# Patient Record
Sex: Male | Born: 1995 | Race: Black or African American | Hispanic: No | Marital: Single | Smoking: Never smoker
Health system: Southern US, Community
[De-identification: ages and names within clinical notes are randomized; demographics above are authoritative.]

## PROBLEM LIST (undated history)

## (undated) DIAGNOSIS — S52502A Unspecified fracture of the lower end of left radius, initial encounter for closed fracture: Secondary | ICD-10-CM

## (undated) HISTORY — PX: NO PAST SURGERIES: SHX2092

---

## 2016-03-22 ENCOUNTER — Emergency Department (HOSPITAL_COMMUNITY): Payer: Self-pay

## 2016-03-22 ENCOUNTER — Emergency Department (HOSPITAL_COMMUNITY)
Admission: EM | Admit: 2016-03-22 | Discharge: 2016-03-22 | Disposition: A | Discharge status: Home / Self Care | Payer: Self-pay | Attending: Emergency Medicine | Admitting: Emergency Medicine

## 2016-03-22 ENCOUNTER — Encounter (HOSPITAL_COMMUNITY): Payer: Self-pay | Admitting: *Deleted

## 2016-03-22 DIAGNOSIS — W500XXA Accidental hit or strike by another person, initial encounter: Secondary | ICD-10-CM | POA: Insufficient documentation

## 2016-03-22 DIAGNOSIS — F1290 Cannabis use, unspecified, uncomplicated: Secondary | ICD-10-CM | POA: Insufficient documentation

## 2016-03-22 DIAGNOSIS — S52502A Unspecified fracture of the lower end of left radius, initial encounter for closed fracture: Secondary | ICD-10-CM | POA: Insufficient documentation

## 2016-03-22 DIAGNOSIS — Y929 Unspecified place or not applicable: Secondary | ICD-10-CM | POA: Insufficient documentation

## 2016-03-22 DIAGNOSIS — S6992XA Unspecified injury of left wrist, hand and finger(s), initial encounter: Secondary | ICD-10-CM

## 2016-03-22 DIAGNOSIS — S5290XA Unspecified fracture of unspecified forearm, initial encounter for closed fracture: Secondary | ICD-10-CM

## 2016-03-22 DIAGNOSIS — Y998 Other external cause status: Secondary | ICD-10-CM | POA: Insufficient documentation

## 2016-03-22 DIAGNOSIS — Y9361 Activity, american tackle football: Secondary | ICD-10-CM | POA: Insufficient documentation

## 2016-03-22 DIAGNOSIS — Q7192 Unspecified reduction defect of left upper limb: Secondary | ICD-10-CM | POA: Insufficient documentation

## 2016-03-22 DIAGNOSIS — S5292XA Unspecified fracture of left forearm, initial encounter for closed fracture: Secondary | ICD-10-CM

## 2016-03-22 HISTORY — DX: Unspecified fracture of the lower end of left radius, initial encounter for closed fracture: S52.502A

## 2016-03-22 MED ORDER — FENTANYL CITRATE (PF) 100 MCG/2ML IJ SOLN
100.0000 ug | INTRAMUSCULAR | Status: DC | PRN
  Administered 2016-03-22 (×2): 100 ug via INTRAVENOUS
  Filled 2016-03-22: qty 2

## 2016-03-22 MED ORDER — PROPOFOL 10 MG/ML IV BOLUS
200.0000 mg | Freq: Once | INTRAVENOUS | Status: AC
  Administered 2016-03-22: 160 mg via INTRAVENOUS
  Filled 2016-03-22: qty 20

## 2016-03-22 MED ORDER — OXYCODONE HCL 5 MG PO TABS: 5.0000 mg | ORAL_TABLET | ORAL | 0 refills | Status: DC | PRN

## 2016-03-22 NOTE — Consult Note (Signed)
ORTHOPAEDIC CONSULTATION HISTORY & PHYSICAL REQUESTING PHYSICIAN: Rolland Porter, MD  Chief Complaint: Left wrist injury  HPI: Kenneth Haynes is a 20 y.o. 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 who presents for evaluation of a left wrist injury that occurred in again today.  He presented with swelling, deformity, and pain of the left wrist.  X-rays again obtained revealing a markedly displaced distal radius fracture with a separate third fragment.  It is difficult to tell whether there is intra-articular extension.  History reviewed. No pertinent past medical history. History reviewed. No pertinent surgical history. Social History   Social History  . Marital status: Single    Spouse name: N/A  . Number of children: N/A  . Years of education: N/A   Social History Main Topics  . Smoking status: None  . Smokeless tobacco: None  . Alcohol use None  . Drug use: Unknown  . Sexual activity: Not Asked   Other Topics Concern  . None   Social History Narrative  . None   No family history on file. No Known Allergies Prior to Admission medications   Not on File   Dg Wrist 2 Views Left  Addendum Date: 03/22/2016   ADDENDUM REPORT: 03/22/2016 16:13 ADDENDUM: A lateral view was performed and demonstrates 1.8 cm dorsal displacement and apex volar angulation of the distal radial fracture. Electronically Signed   By: Harmon Pier M.D.   On: 03/22/2016 16:13   Result Date: 03/22/2016 CLINICAL DATA:  Acute left wrist pain and deformity following football injury today. Initial encounter. EXAM: LEFT WRIST - 2 VIEW COMPARISON:  None. FINDINGS: A mildly comminuted distal radial fracture is noted with at least 2 cm ulnar and dorsal displacement and angulation. The radiocarpal joint appears intact. No other fractures are identified. IMPRESSION: Displaced and angulated mildly comminuted distal radial fracture. Electronically Signed: By: Harmon Pier M.D. On: 03/22/2016 15:15    Positive ROS: All other  systems have been reviewed and were otherwise negative with the exception of those mentioned in the HPI and as above.  Physical Exam: Vitals: Refer to EMR. Constitutional:  WD, WN, NAD HEENT:  NCAT, EOMI Neuro/Psych:  Alert & oriented to person, place, and time; appropriate mood & affect Lymphatic: No generalized extremity edema or lymphadenopathy Extremities / MSK:  The extremities are normal with respect to appearance, ranges of motion, joint stability, muscle strength/tone, sensation, & perfusion except as otherwise noted:  Left wrist markedly deformed with obvious dorsal translational displacement of the distal aspect.  The wrist is swollen, forearm compartments not tense.  Intact light touch sensibility in the radial, median, and ulnar nerve distributions with intact motor to the same.  Nailbeds pink, capillary refill brisk.  Radial pulse palpable.  No tenderness about the elbow.  Assessment: Displaced left distal radius fracture with at least 2 distal fragments  Plan: I discussed these findings with the patient, his mother, and his coach.  I recommended a plan for closed reduction today in emergency department with conscious sedation provided by the emergency department.  They consented to proceed in this was performed with gentle manipulative reduction.  Portable x-ray was available and guided the reduction.  The alignment of the distal articular margin shaft of the radius was near-anatomic following reduction, but the third separate fragment appeared to be an extruded fragment and was resting volar to the re-remainder of the radius.  Sugar tong splint was applied and he was placed into a sling.  He will be discharged in emergency department  today with precautions regarding neurovascular compromise and swelling, an analgesic plan, and we will also obtain a CT scan of the left wrist prior to discharge to assist in operative planning.  My office will contact him Monday or Tuesday regarding  follow-up plan and timing for surgery, which is likely to be on Monday, 03-30-16.  Cliffton Astersavid A. Janee Mornhompson, MD      [facility]edic & [facility] [address] [place], [place]  [id number] Office: [phone] Mobile: [phone]  03/22/2016, 5:12 PM

## 2016-03-22 NOTE — ED Triage Notes (Addendum)
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  Patient received 100 mcg of Fentanyl en route which eliminated his pain initially, however, his pain is now 10/10.  Patient's vitals WNL.  Patient's LUE is splinted, difficult to palpate pulse.

## 2016-03-22 NOTE — ED Provider Notes (Signed)
[facility]-EMERGENCY DEPT Provider Note   CSN: [id number] Arrival date & time: 03/22/16  1432     History   Chief Complaint Chief Complaint  Patient presents with  . Wrist Injury    left     HPI Kenneth Haynes is a 20 y.o. male. 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 He was being tackled today. Was holding the ball with his right hand. He fell and another player fell across his left upper extremity. He has a deformity of his left wrist. He arrives via ambulance with his arm in a sling.  HPI  History reviewed. No pertinent past medical history.  There are no active problems to display for this patient.   History reviewed. No pertinent surgical history.     Home Medications    Prior to Admission medications   Medication Sig Start Date End Date Taking? Authorizing Provider  oxyCODONE (ROXICODONE) 5 MG immediate release tablet Take 1 tablet (5 mg total) by mouth every 4 (four) hours as needed for severe pain or breakthrough pain (unrelieved by ibuprofen and tylenol). 03/22/16   Mack Hook, MD    Family History No family history on file.  Social History Social History  Substance Use Topics  . Smoking status: Not on file  . Smokeless tobacco: Not on file  . Alcohol use Not on file     Allergies   Review of patient's allergies indicates no known allergies.   Review of Systems Review of Systems  Constitutional: Negative for appetite change, chills, diaphoresis, fatigue and fever.  HENT: Negative for mouth sores, sore throat and trouble swallowing.   Eyes: Negative for visual disturbance.  Respiratory: Negative for cough, chest tightness, shortness of breath and wheezing.   Cardiovascular: Negative for chest pain.  Gastrointestinal: Negative for abdominal distention, abdominal pain, diarrhea, nausea and vomiting.  Endocrine: Negative for polydipsia, polyphagia and polyuria.  Genitourinary: Negative for dysuria, frequency and hematuria.    Musculoskeletal: Negative for gait problem.       Left wrist pain and deformity.  Skin: Negative for color change, pallor and rash.  Neurological: Negative for dizziness, syncope, light-headedness and headaches.  Hematological: Does not bruise/bleed easily.  Psychiatric/Behavioral: Negative for behavioral problems and confusion.     Physical Exam Updated Vital Signs BP 127/67   Pulse 74   Temp 98 F (36.7 C)   Resp 17   Wt 250 lb (113.4 kg)   SpO2 99%   Physical Exam  Constitutional: He is oriented to person, place, and time. He appears well-developed and well-nourished. No distress.  HENT:  Head: Normocephalic.  Eyes: Conjunctivae are normal. Pupils are equal, round, and reactive to light. No scleral icterus.  Neck: Normal range of motion. Neck supple. No thyromegaly present.  Cardiovascular: Normal rate and regular rhythm.  Exam reveals no gallop and no friction rub.   No murmur heard. Pulmonary/Chest: Effort normal and breath sounds normal. No respiratory distress. He has no wheezes. He has no rales.  Abdominal: Soft. Bowel sounds are normal. He exhibits no distension. There is no tenderness. There is no rebound.  Musculoskeletal: Normal range of motion.  45 angulation of apex volar deformity of the left distal wrist. Good sensation Refill in all digits. Closed injury. No tenting or compromise of the skin.  Neurological: He is alert and oriented to person, place, and time.  Skin: Skin is warm and dry. No rash noted.  Psychiatric: He has a normal mood and affect. His behavior is  normal.     ED Treatments / Results  Labs (all labs ordered are listed, but only abnormal results are displayed) Labs Reviewed - No data to display  EKG  EKG Interpretation None       Radiology Dg Wrist 2 Views Left  Addendum Date: 03/22/2016   ADDENDUM REPORT: 03/22/2016 16:13 ADDENDUM: A lateral view was performed and demonstrates 1.8 cm dorsal displacement and apex volar angulation  of the distal radial fracture. Electronically Signed   By: Harmon Pier M.D.   On: 03/22/2016 16:13   Result Date: 03/22/2016 CLINICAL DATA:  Acute left wrist pain and deformity following football injury today. Initial encounter. EXAM: LEFT WRIST - 2 VIEW COMPARISON:  None. FINDINGS: A mildly comminuted distal radial fracture is noted with at least 2 cm ulnar and dorsal displacement and angulation. The radiocarpal joint appears intact. No other fractures are identified. IMPRESSION: Displaced and angulated mildly comminuted distal radial fracture. Electronically Signed: By: Harmon Pier M.D. On: 03/22/2016 15:15   Dg Wrist Complete Left  Result Date: 03/22/2016 CLINICAL DATA:  Postreduction distal radial fracture. EXAM: LEFT WRIST - COMPLETE 3+ VIEW COMPARISON:  None. FINDINGS: A distal radial fracture is again identified. A 1.3 x 1.7 cm fragment is displaced 2 cm anteriorly. No subluxation or dislocation identified. IMPRESSION: Distal radial fracture again identified with moderate to large fragment displaced 2 cm anteriorly. Electronically Signed   By: Harmon Pier M.D.   On: 03/22/2016 17:28   Ct Wrist Left Wo Contrast  Result Date: 03/22/2016 CLINICAL DATA:  Football injury with left wrist deformity and swelling. EXAM: CT OF THE LEFT WRIST WITHOUT CONTRAST TECHNIQUE: Multidetector CT imaging was performed according to the standard protocol. Multiplanar CT image reconstructions were also generated. COMPARISON:  Plain films same day. FINDINGS: Bones/Joint/Cartilage Examination demonstrates evidence patient's known comminuted fracture of the distal radial metaphysis with extension to the articular surface. There is near anatomic alignment about the fracture site, although there is a 2.4 cm displaced fragment along the volar aspect of the fracture. This fragment lies along the radial aspect of the extensor tendons. Distal radial ulnar joint is within normal. No additional fractures are visualized. Ligaments  Suboptimally assessed by CT. Muscles and Tendons Within normal. Soft tissues Mild generalized subcutaneous edema over the wrist. IMPRESSION: Evidence of patient's known comminuted fracture of the distal radial metaphysis. There is near anatomic alignment about the fracture site, although there is a displaced 2.4 cm fragment anteriorly at the fracture site lying along the radial aspect of the extension tendons. Electronically Signed   By: Elberta Fortis M.D.   On: 03/22/2016 20:28    Procedures Procedures (including critical care time)  Medications Ordered in ED Medications  propofol (DIPRIVAN) 10 mg/mL bolus/IV push 200 mg (160 mg Intravenous Given 03/22/16 1659)     Initial Impression / Assessment and Plan / ED Course  I have reviewed the triage vital signs and the nursing notes.  Pertinent labs & imaging results that were available during my care of the patient were reviewed by me and considered in my medical decision making (see chart for details).  Clinical Course    She discussed Dr. Mack Hook. He is available for reduction. He was involved with a procedure at [facility].  He presents himself quickly for reduction here. I agreed to provide conscious sedation for his planned reduction. Patient meets criteria for propofol has no contraindications. Consent was obtained and timeout was performed.      Procedural sedation Performed  by: Fayrene FearingJAMES, [place] JOSEPH Consent: Verbal consent obtained. Risks and benefits: risks, benefits and alternatives were discussed Required items: required blood products, implants, devices, and special equipment available Patient identity confirmed: arm band and provided demographic data Time out: Immediately prior to procedure a "time out" was called to verify the correct patient, procedure, equipment, support staff and site/side marked as required.  Sedation type: moderate (conscious) sedation NPO time confirmed and considedered  Sedatives:  PROPOFOL  Physician Time at Bedside: 30 minutes  Vitals: Vital signs were monitored during sedation. Cardiac Monitor, pulse oximeter Patient tolerance: Patient tolerated the procedure well with no immediate complications. Comments: Pt with uneventful recovered. Returned to pre-procedural sedation baseline     Final Clinical Impressions(s) / ED Diagnoses   Final diagnoses:  Reduction deformity of upper limb, left  Radial fracture, left, closed, initial encounter    Reduction, post procedure x-rays, splinting, and arranging for follow-up arranged personally by Dr. Mack Hookavid Thompson. Please see his included note  New Prescriptions Discharge Medication List as of 03/22/2016  5:25 PM    START taking these medications   Details  oxyCODONE (ROXICODONE) 5 MG immediate release tablet Take 1 tablet (5 mg total) by mouth every 4 (four) hours as needed for severe pain or breakthrough pain (unrelieved by ibuprofen and tylenol)., Starting Sun 03/22/2016, Print         Rolland PorterMark Kimberl Vig, MD 03/22/16 [phone]

## 2016-03-22 NOTE — Discharge Instructions (Signed)
Cast or Splint Care °Casts and splints support injured limbs and keep bones from moving while they heal. It is important to care for your cast or splint at home.   °HOME CARE INSTRUCTIONS °· Keep the cast or splint uncovered during the drying period. It can take 24 to 48 hours to dry if it is made of plaster. A fiberglass cast will dry in less than 1 hour. °· Do not rest the cast on anything harder than a pillow for the first 24 hours. °· Do not put weight on your injured limb or apply pressure to the cast until your health care provider gives you permission. °· Keep the cast or splint dry. Wet casts or splints can lose their shape and may not support the limb as well. A wet cast that has lost its shape can also create harmful pressure on your skin when it dries. Also, wet skin can become infected. °· Cover the cast or splint with a plastic bag when bathing or when out in the rain or snow. If the cast is on the trunk of the body, take sponge baths until the cast is removed. °· If your cast does become wet, dry it with a towel or a blow dryer on the cool setting only. °· Keep your cast or splint clean. Soiled casts may be wiped with a moistened cloth. °· Do not place any hard or soft foreign objects under your cast or splint, such as cotton, toilet paper, lotion, or powder. °· Do not try to scratch the skin under the cast with any object. The object could get stuck inside the cast. Also, scratching could lead to an infection. If itching is a problem, use a blow dryer on a cool setting to relieve discomfort. °· Do not trim or cut your cast or remove padding from inside of it. °· Exercise all joints next to the injury that are not immobilized by the cast or splint. For example, if you have a long leg cast, exercise the hip joint and toes. If you have an arm cast or splint, exercise the shoulder, elbow, thumb, and fingers. °· Elevate your injured arm or leg on 1 or 2 pillows for the first 1 to 3 days to decrease  swelling and pain. It is best if you can comfortably elevate your cast so it is higher than your heart. °SEEK MEDICAL CARE IF:  °· Your cast or splint cracks. °· Your cast or splint is too tight or too loose. °· You have unbearable itching inside the cast. °· Your cast becomes wet or develops a soft spot or area. °· You have a bad smell coming from inside your cast. °· You get an object stuck under your cast. °· Your skin around the cast becomes red or raw. °· You have new pain or worsening pain after the cast has been applied. °SEEK IMMEDIATE MEDICAL CARE IF:  °· You have fluid leaking through the cast. °· You are unable to move your fingers or toes. °· You have discolored (blue or white), cool, painful, or very swollen fingers or toes beyond the cast. °· You have tingling or numbness around the injured area. °· You have severe pain or pressure under the cast. °· You have any difficulty with your breathing or have shortness of breath. °· You have chest pain. °  °This information is not intended to replace advice given to you by your health care provider. Make sure you discuss any questions you have with your health care   provider. °  °Document Released: 06/19/2000 Document Revised: 04/12/2013 Document Reviewed: 12/29/2012 °Elsevier Interactive Patient Education ©2016 Elsevier Inc[employer] ° °Forearm Fracture °A forearm fracture is a break in one or both of the bones of your arm that are between the elbow and the wrist. Your forearm is made up of two bones: °· Radius. This is the bone on the inside of your arm near your thumb. °· Ulna. This is the bone on the outside of your arm near your little finger. °Middle forearm fractures usually break both the radius and the ulna. Most forearm fractures that involve both the ulna and radius will require surgery. °CAUSES °Common causes of this type of fracture include: °· Falling on an outstretched arm. °· Accidents, such as a car or bike accident. °· A hard, direct hit to the middle  part of your arm. °RISK FACTORS °You may be at higher risk for this type of fracture if: °· You play contact sports. °· You have a condition that causes your bones to be weak or thin (osteoporosis). °SIGNS AND SYMPTOMS °A forearm fracture causes pain immediately after the injury. Other signs and symptoms include: °· An abnormal bend or bump in your arm (deformity). °· Swelling. °· Numbness or tingling. °· Tenderness. °· Inability to turn your hand from side to side (rotate). °· Bruising. °DIAGNOSIS °Your health care provider may diagnose a forearm fracture based on: °· Your symptoms. °· Your medical history, including any recent injury. °· A physical exam. Your health care provider will look for any deformity and feel for tenderness over the break. Your health care provider will also check whether the bones are out of place. °· An X-ray exam to confirm the diagnosis and learn more about the type of fracture. °TREATMENT °The goals of treatment are to get the bone or bones in proper position for healing and to keep the bones from moving so they will heal over time. Your treatment will depend on many factors, especially the type of fracture that you have. °· If the fractured bone or bones: °¨ Are in the correct position (nondisplaced), you may only need to wear a cast or a splint. °¨ Have a slightly displaced fracture, you may need to have the bones moved back into place manually (closed reduction) before the splint or cast is put on. °· You may have a temporary splint before you have a cast. The splint allows room for some swelling. After a few days, a cast can replace the splint. °· You may have to wear the cast for 6-8 weeks or as directed by your health care provider. °· The cast may be changed after about 3 weeks or as directed by your health care provider. °· After your cast is removed, you may need physical therapy to regain full movement in your wrist or elbow. °· You may need emergency surgery if you  have: °¨ A fractured bone or bones that are out of position (displaced). °¨ A fracture with multiple fragments (comminuted fracture). °¨ A fracture that breaks the skin (open fracture). This type of fracture may require surgical wires, plates, or screws to hold the bone or bones in place. °· You may have X-rays every couple of weeks to check on your healing. °HOME CARE INSTRUCTIONS °If You Have a Cast: °· Do not stick anything inside the cast to scratch your skin. Doing that increases your risk of infection. °· Check the skin around the cast every day. Report any concerns to your health care   provider. You may put lotion on dry skin around the edges of the cast. Do not apply lotion to the skin underneath the cast. °If You Have a Splint: °· Wear it as directed by your health care provider. Remove it only as directed by your health care provider. °· Loosen the splint if your fingers become numb and tingle, or if they turn cold and blue. °Bathing °· Cover the cast or splint with a watertight plastic bag to protect it from water while you bathe or shower. Do not let the cast or splint get wet. °Managing Pain, Stiffness, and Swelling °· If directed, apply ice to the injured area: °¨ Put ice in a plastic bag. °¨ Place a towel between your skin and the bag. °¨ Leave the ice on for 20 minutes, 2-3 times a day. °· Move your fingers often to avoid stiffness and to lessen swelling. °· Raise the injured area above the level of your heart while you are sitting or lying down. °Driving °· Do not drive or operate heavy machinery while taking pain medicine. °· Do not drive while wearing a cast or splint on a hand that you use for driving. °Activity °· Return to your normal activities as directed by your health care provider. Ask your health care provider what activities are safe for you. °· Perform range-of-motion exercises only as directed by your health care provider. °Safety °· Do not use your injured limb to support your body  weight until your health care provider says that you can. °General Instructions °· Do not put pressure on any part of the cast or splint until it is fully hardened. This may take several hours. °· Keep the cast or splint clean and dry. °· Do not use any tobacco products, including cigarettes, chewing tobacco, or electronic cigarettes. Tobacco can delay bone healing. If you need help quitting, ask your health care provider. °· Take medicines only as directed by your health care provider. °· Keep all follow-up visits as directed by your health care provider. This is important. °SEEK MEDICAL CARE IF: °· Your pain medicine is not helping. °· Your cast or splint becomes wet or damaged or suddenly feels too tight. °· Your cast becomes loose. °· You have more severe pain or swelling than you did before the cast. °· You have severe pain when you stretch your fingers. °· You continue to have pain or stiffness in your elbow or your wrist after your cast is removed. °SEEK IMMEDIATE MEDICAL CARE IF: °· You cannot move your fingers. °· You lose feeling in your fingers or your hand. °· Your hand or your fingers turn cold and pale or blue. °· You notice a bad smell coming from your cast. °· You have drainage from underneath your cast. °· You have new stains from blood or drainage that is coming through your cast. °  °This information is not intended to replace advice given to you by your health care provider. Make sure you discuss any questions you have with your health care provider. °  °Document Released: 06/19/2000 Document Revised: 07/13/2014 Document Reviewed: 02/05/2014 °Elsevier Interactive Patient Education ©2016 Elsevier Inc. ° °

## 2016-03-22 NOTE — ED Notes (Signed)
Xray and Ortho called to be at bedside per Dr Janee Mornhompson.

## 2016-03-22 NOTE — ED Notes (Signed)
Bed: [facility]20 Expected date: 03/22/16 Expected time: 2:35 PM Means of arrival: Ambulance Comments: Wrist injury

## 2016-03-24 ENCOUNTER — Other Ambulatory Visit: Payer: Self-pay | Admitting: Orthopedic Surgery

## 2016-03-24 ENCOUNTER — Encounter (HOSPITAL_BASED_OUTPATIENT_CLINIC_OR_DEPARTMENT_OTHER): Payer: Self-pay | Admitting: *Deleted

## 2016-03-30 ENCOUNTER — Ambulatory Visit (HOSPITAL_BASED_OUTPATIENT_CLINIC_OR_DEPARTMENT_OTHER): Payer: Self-pay | Admitting: Anesthesiology

## 2016-03-30 ENCOUNTER — Ambulatory Visit (HOSPITAL_COMMUNITY): Payer: Self-pay

## 2016-03-30 ENCOUNTER — Encounter (HOSPITAL_BASED_OUTPATIENT_CLINIC_OR_DEPARTMENT_OTHER): Payer: Self-pay

## 2016-03-30 ENCOUNTER — Encounter (HOSPITAL_BASED_OUTPATIENT_CLINIC_OR_DEPARTMENT_OTHER)
Admission: RE | Disposition: A | Discharge status: Home / Self Care | Payer: Self-pay | Source: Ambulatory Visit | Attending: Orthopedic Surgery

## 2016-03-30 ENCOUNTER — Ambulatory Visit (HOSPITAL_BASED_OUTPATIENT_CLINIC_OR_DEPARTMENT_OTHER)
Admission: RE | Admit: 2016-03-30 | Discharge: 2016-03-30 | Disposition: A | Discharge status: Home / Self Care | Payer: Self-pay | Source: Ambulatory Visit | Attending: Orthopedic Surgery | Admitting: Orthopedic Surgery

## 2016-03-30 DIAGNOSIS — Z419 Encounter for procedure for purposes other than remedying health state, unspecified: Secondary | ICD-10-CM

## 2016-03-30 DIAGNOSIS — S52552A Other extraarticular fracture of lower end of left radius, initial encounter for closed fracture: Secondary | ICD-10-CM | POA: Insufficient documentation

## 2016-03-30 DIAGNOSIS — F329 Major depressive disorder, single episode, unspecified: Secondary | ICD-10-CM | POA: Insufficient documentation

## 2016-03-30 DIAGNOSIS — X58XXXA Exposure to other specified factors, initial encounter: Secondary | ICD-10-CM | POA: Insufficient documentation

## 2016-03-30 HISTORY — PX: OPEN REDUCTION INTERNAL FIXATION (ORIF) DISTAL RADIAL FRACTURE: SHX5989

## 2016-03-30 HISTORY — DX: Unspecified fracture of the lower end of left radius, initial encounter for closed fracture: S52.502A

## 2016-03-30 SURGERY — OPEN REDUCTION INTERNAL FIXATION (ORIF) DISTAL RADIUS FRACTURE
Anesthesia: General | Site: Wrist | Laterality: Left

## 2016-03-30 MED ORDER — LIDOCAINE 2% (20 MG/ML) 5 ML SYRINGE
INTRAMUSCULAR | Status: DC | PRN
  Administered 2016-03-30: 100 mg via INTRAVENOUS

## 2016-03-30 MED ORDER — CEFAZOLIN SODIUM-DEXTROSE 2-4 GM/100ML-% IV SOLN
2.0000 g | INTRAVENOUS | Status: AC
  Administered 2016-03-30: 2 g via INTRAVENOUS

## 2016-03-30 MED ORDER — LIDOCAINE 2% (20 MG/ML) 5 ML SYRINGE
INTRAMUSCULAR | Status: AC
  Filled 2016-03-30: qty 5

## 2016-03-30 MED ORDER — ONDANSETRON HCL 4 MG/2ML IJ SOLN: 4.0000 mg | Freq: Once | INTRAMUSCULAR | Status: DC | PRN

## 2016-03-30 MED ORDER — MEPERIDINE HCL 25 MG/ML IJ SOLN: 6.2500 mg | INTRAMUSCULAR | Status: DC | PRN

## 2016-03-30 MED ORDER — LACTATED RINGERS IV SOLN
INTRAVENOUS | Status: DC
  Administered 2016-03-30: 10:00:00 via INTRAVENOUS

## 2016-03-30 MED ORDER — FENTANYL CITRATE (PF) 100 MCG/2ML IJ SOLN
INTRAMUSCULAR | Status: AC
  Filled 2016-03-30: qty 2

## 2016-03-30 MED ORDER — DEXAMETHASONE SODIUM PHOSPHATE 4 MG/ML IJ SOLN
INTRAMUSCULAR | Status: DC | PRN
  Administered 2016-03-30: 10 mg via INTRAVENOUS

## 2016-03-30 MED ORDER — PROPOFOL 10 MG/ML IV BOLUS
INTRAVENOUS | Status: AC
  Filled 2016-03-30: qty 20

## 2016-03-30 MED ORDER — PROPOFOL 10 MG/ML IV BOLUS
INTRAVENOUS | Status: DC | PRN
  Administered 2016-03-30: 200 mg via INTRAVENOUS

## 2016-03-30 MED ORDER — LACTATED RINGERS IV SOLN
INTRAVENOUS | Status: DC
  Administered 2016-03-30: 11:00:00 via INTRAVENOUS

## 2016-03-30 MED ORDER — FENTANYL CITRATE (PF) 100 MCG/2ML IJ SOLN
50.0000 ug | INTRAMUSCULAR | Status: DC | PRN
  Administered 2016-03-30 (×2): 100 ug via INTRAVENOUS

## 2016-03-30 MED ORDER — OXYCODONE-ACETAMINOPHEN 5-325 MG PO TABS: 1.0000 | ORAL_TABLET | ORAL | 0 refills | Status: AC | PRN

## 2016-03-30 MED ORDER — HYDROMORPHONE HCL 1 MG/ML IJ SOLN: 0.2500 mg | INTRAMUSCULAR | Status: DC | PRN

## 2016-03-30 MED ORDER — CEFAZOLIN SODIUM-DEXTROSE 2-4 GM/100ML-% IV SOLN
INTRAVENOUS | Status: AC
  Filled 2016-03-30: qty 100

## 2016-03-30 MED ORDER — OXYCODONE HCL 5 MG/5ML PO SOLN: 5.0000 mg | Freq: Once | ORAL | Status: DC | PRN

## 2016-03-30 MED ORDER — SCOPOLAMINE 1 MG/3DAYS TD PT72
1.0000 | MEDICATED_PATCH | Freq: Once | TRANSDERMAL | Status: DC | PRN
Start: 2016-03-30 — End: 2016-03-30

## 2016-03-30 MED ORDER — GLYCOPYRROLATE 0.2 MG/ML IJ SOLN: 0.2000 mg | Freq: Once | INTRAMUSCULAR | Status: DC | PRN

## 2016-03-30 MED ORDER — MIDAZOLAM HCL 2 MG/2ML IJ SOLN
1.0000 mg | INTRAMUSCULAR | Status: DC | PRN
  Administered 2016-03-30: 2 mg via INTRAVENOUS

## 2016-03-30 MED ORDER — DEXAMETHASONE SODIUM PHOSPHATE 10 MG/ML IJ SOLN
INTRAMUSCULAR | Status: AC
  Filled 2016-03-30: qty 1

## 2016-03-30 MED ORDER — MIDAZOLAM HCL 2 MG/2ML IJ SOLN
INTRAMUSCULAR | Status: AC
  Filled 2016-03-30: qty 2

## 2016-03-30 MED ORDER — ONDANSETRON HCL 4 MG/2ML IJ SOLN
INTRAMUSCULAR | Status: DC | PRN
  Administered 2016-03-30: 4 mg via INTRAVENOUS

## 2016-03-30 MED ORDER — BUPIVACAINE-EPINEPHRINE (PF) 0.5% -1:200000 IJ SOLN
INTRAMUSCULAR | Status: DC | PRN
  Administered 2016-03-30: 25 mL via PERINEURAL

## 2016-03-30 MED ORDER — OXYCODONE HCL 5 MG PO TABS: 5.0000 mg | ORAL_TABLET | Freq: Once | ORAL | Status: DC | PRN

## 2016-03-30 MED ORDER — 0.9 % SODIUM CHLORIDE (POUR BTL) OPTIME
TOPICAL | Status: DC | PRN
  Administered 2016-03-30: 500 mL

## 2016-03-30 SURGICAL SUPPLY — 70 items
BANDAGE COBAN STERILE 2 (GAUZE/BANDAGES/DRESSINGS) IMPLANT
BIT DRILL SOLID 2.0X40MM (BIT) IMPLANT
BIT DRILL SOLID 2.5X40MM (BIT) IMPLANT
BLADE MINI RND TIP GREEN BEAV (BLADE) IMPLANT
BLADE SURG 15 STRL LF DISP TIS (BLADE) ×2 IMPLANT
BLADE SURG 15 STRL SS (BLADE) ×4
BNDG COHESIVE 4X5 TAN STRL (GAUZE/BANDAGES/DRESSINGS) ×3 IMPLANT
BNDG ESMARK 4X9 LF (GAUZE/BANDAGES/DRESSINGS) ×3 IMPLANT
BNDG GAUZE ELAST 4 BULKY (GAUZE/BANDAGES/DRESSINGS) ×3 IMPLANT
BRUSH SCRUB EZ PLAIN DRY (MISCELLANEOUS) IMPLANT
CANISTER SUCT 1200ML W/VALVE (MISCELLANEOUS) ×3 IMPLANT
CHLORAPREP W/TINT 26ML (MISCELLANEOUS) ×3 IMPLANT
CLEANER CAUTERY TIP 5X5 PAD (MISCELLANEOUS) ×1 IMPLANT
CORDS BIPOLAR (ELECTRODE) ×3 IMPLANT
COVER BACK TABLE 60X90IN (DRAPES) ×3 IMPLANT
COVER MAYO STAND STRL (DRAPES) ×3 IMPLANT
CUFF TOURNIQUET SINGLE 18IN (TOURNIQUET CUFF) ×3 IMPLANT
CUFF TOURNIQUET SINGLE 24IN (TOURNIQUET CUFF) IMPLANT
DRAPE C-ARM 42X72 X-RAY (DRAPES) ×3 IMPLANT
DRAPE EXTREMITY T 121X128X90 (DRAPE) ×3 IMPLANT
DRAPE SURG 17X23 STRL (DRAPES) ×3 IMPLANT
DRILL SOLID 2.0X40MM (BIT)
DRILL SOLID 2.5X40MM (BIT)
DRSG ADAPTIC 3X8 NADH LF (GAUZE/BANDAGES/DRESSINGS) ×3 IMPLANT
DRSG EMULSION OIL 3X3 NADH (GAUZE/BANDAGES/DRESSINGS) IMPLANT
ELECT REM PT RETURN 9FT ADLT (ELECTROSURGICAL) ×3
ELECTRODE REM PT RTRN 9FT ADLT (ELECTROSURGICAL) ×1 IMPLANT
GAUZE SPONGE 4X4 12PLY STRL (GAUZE/BANDAGES/DRESSINGS) ×3 IMPLANT
GLOVE BIO SURGEON STRL SZ 6.5 (GLOVE) ×2 IMPLANT
GLOVE BIO SURGEON STRL SZ7.5 (GLOVE) ×3 IMPLANT
GLOVE BIO SURGEONS STRL SZ 6.5 (GLOVE) ×1
GLOVE BIOGEL PI IND STRL 7.0 (GLOVE) ×3 IMPLANT
GLOVE BIOGEL PI IND STRL 8 (GLOVE) ×1 IMPLANT
GLOVE BIOGEL PI INDICATOR 7.0 (GLOVE) ×6
GLOVE BIOGEL PI INDICATOR 8 (GLOVE) ×2
GLOVE ECLIPSE 6.5 STRL STRAW (GLOVE) ×3 IMPLANT
GOWN STRL REUS W/ TWL LRG LVL3 (GOWN DISPOSABLE) ×2 IMPLANT
GOWN STRL REUS W/TWL LRG LVL3 (GOWN DISPOSABLE) ×4
GOWN STRL REUS W/TWL XL LVL3 (GOWN DISPOSABLE) ×3 IMPLANT
GUIDE AIMING 1.5MM (WIRE) ×3 IMPLANT
NEEDLE HYPO 25X1 1.5 SAFETY (NEEDLE) IMPLANT
NS IRRIG 1000ML POUR BTL (IV SOLUTION) ×3 IMPLANT
PACK BASIN DAY SURGERY FS (CUSTOM PROCEDURE TRAY) ×3 IMPLANT
PAD CLEANER CAUTERY TIP 5X5 (MISCELLANEOUS) ×2
PADDING CAST ABS 4INX4YD NS (CAST SUPPLIES) ×2
PADDING CAST ABS COTTON 4X4 ST (CAST SUPPLIES) ×1 IMPLANT
PENCIL BUTTON HOLSTER BLD 10FT (ELECTRODE) ×3 IMPLANT
RUBBERBAND STERILE (MISCELLANEOUS) IMPLANT
SKELETAL DYNAMICS DVR SET (Set) ×3 IMPLANT
SLEEVE SCD COMPRESS KNEE MED (MISCELLANEOUS) ×3 IMPLANT
SLING ARM FOAM STRAP LRG (SOFTGOODS) ×3 IMPLANT
SPLINT PLASTER CAST XFAST 4X15 (CAST SUPPLIES) ×8 IMPLANT
SPLINT PLASTER XTRA FAST SET 4 (CAST SUPPLIES) ×16
STOCKINETTE 6  STRL (DRAPES) ×2
STOCKINETTE 6 STRL (DRAPES) ×1 IMPLANT
SUCTION FRAZIER HANDLE 10FR (MISCELLANEOUS) ×2
SUCTION TUBE FRAZIER 10FR DISP (MISCELLANEOUS) ×1 IMPLANT
SUT VIC AB 2-0 PS2 27 (SUTURE) ×3 IMPLANT
SUT VICRYL 4-0 PS2 18IN ABS (SUTURE) IMPLANT
SUT VICRYL RAPIDE 4-0 (SUTURE) IMPLANT
SUT VICRYL RAPIDE 4/0 PS 2 (SUTURE) ×3 IMPLANT
SYR BULB 3OZ (MISCELLANEOUS) ×3 IMPLANT
SYRINGE 10CC LL (SYRINGE) IMPLANT
TOWEL OR 17X24 6PK STRL BLUE (TOWEL DISPOSABLE) ×3 IMPLANT
TOWEL OR NON WOVEN STRL DISP B (DISPOSABLE) ×3 IMPLANT
TUBE CONNECTING 20'X1/4 (TUBING) ×1
TUBE CONNECTING 20X1/4 (TUBING) ×2 IMPLANT
UNDERPAD 30X30 (UNDERPADS AND DIAPERS) ×3 IMPLANT
WIRE FIX 1.5 STANDARD TIP (WIRE)
WIRE FIX 1.5 STD TIP (WIRE) IMPLANT

## 2016-03-30 NOTE — Anesthesia Preprocedure Evaluation (Signed)
Anesthesia Evaluation  Patient identified by MRN, date of birth, ID band Patient awake    Reviewed: Allergy & Precautions, NPO status , Patient's Chart, lab work & pertinent test results  Airway Mallampati: I  TM Distance: >3 FB Neck ROM: Full    Dental  (+) Teeth Intact, Dental Advisory Given   Pulmonary    breath sounds clear to auscultation       Cardiovascular  Rhythm:Regular Rate:Normal     Neuro/Psych    GI/Hepatic   Endo/Other    Renal/GU      Musculoskeletal   Abdominal   Peds  Hematology   Anesthesia Other Findings   Reproductive/Obstetrics                             Anesthesia Physical Anesthesia Plan  ASA: I  Anesthesia Plan: General   Post-op Pain Management: GA combined w/ Regional for post-op pain   Induction: Intravenous  Airway Management Planned: LMA  Additional Equipment:   Intra-op Plan:   Post-operative Plan: Extubation in OR  Informed Consent: I have reviewed the patients History and Physical, chart, labs and discussed the procedure including the risks, benefits and alternatives for the proposed anesthesia with the patient or authorized representative who has indicated his/her understanding and acceptance.   Dental advisory given  Plan Discussed with: CRNA, Anesthesiologist and Surgeon  Anesthesia Plan Comments:         Anesthesia Quick Evaluation  

## 2016-03-30 NOTE — Progress Notes (Signed)
Assisted Dr. Crews with left, ultrasound guided, infraclavicular block. Side rails up, monitors on throughout procedure. See vital signs in flow sheet. Tolerated Procedure well. 

## 2016-03-30 NOTE — Anesthesia Postprocedure Evaluation (Signed)
Anesthesia Post Note  Patient: Kenneth Haynes  Procedure(s) Performed: Procedure(s) (LRB): OPEN TREATMENT OF LEFT DISTAL RADIUS FRACTURE (Left)  Patient location during evaluation: PACU Anesthesia Type: General Level of consciousness: awake and alert Pain management: pain level controlled Vital Signs Assessment: post-procedure vital signs reviewed and stable Respiratory status: spontaneous breathing, nonlabored ventilation and respiratory function stable Cardiovascular status: blood pressure returned to baseline and stable Postop Assessment: no signs of nausea or vomiting Anesthetic complications: no    Last Vitals:  Vitals:   03/30/16 1330 03/30/16 1345  BP: 109/67 120/75  Pulse: 69 68  Resp: 12 11  Temp:      Last Pain:  Vitals:   03/30/16 1345  TempSrc:   PainSc: 0-No pain                 Muhamad Serano A

## 2016-03-30 NOTE — Anesthesia Procedure Notes (Addendum)
Anesthesia Regional Block:  Infraclavicular brachial plexus block  Pre-Anesthetic Checklist: ,, timeout performed, Correct Patient, Correct Site, Correct Laterality, Correct Procedure, Correct Position, site marked, Risks and benefits discussed,  Surgical consent,  Pre-op evaluation,  At surgeon's request and post-op pain management  Laterality: Left and Upper  Prep: chloraprep       Needles:  Injection technique: Single-shot  Needle Type: Echogenic Stimulator Needle     Needle Length: 5cm 5 cm Needle Gauge: 21 and 21 G    Additional Needles:  Procedures: ultrasound guided (picture in chart) Infraclavicular brachial plexus block Narrative:  Start time: 03/30/2016 10:09 AM End time: 03/30/2016 10:14 AM Injection made incrementally with aspirations every 5 mL.  Performed by: Personally  Anesthesiologist: Able Malloy       Left infraclavicular block image

## 2016-03-30 NOTE — Discharge Instructions (Signed)
Discharge Instructions   You have a dressing with a plaster splint incorporated in it. Move your fingers as much as possible, making a full fist and fully opening the fist. Elevate your hand to reduce pain & swelling of the digits.  Ice over the operative site and or in the arm pit may be helpful to reduce pain & swelling.  DO NOT USE HEAT. Pain medicine has been prescribed for you.  Use your medicine as needed over the first 48 hours, and then you can begin to taper your use. Use 800mg  of ibuprofen 3x per day.  You may use Tylenol in place of your prescribed pain medication, but not IN ADDITION to it. Leave the dressing in place until you return to our office.  You may shower, but keep the bandage clean & dry.  You may drive a car when you are off of prescription pain medications and can safely control your vehicle with both hands. Our office will call you to arrange follow-up   Please call [phone] during normal business hours or [phone] after hours for any problems. Including the following:  - excessive redness of the incisions - drainage for more than 4 days - fever of more than 101.5 F  *Please note that pain medications will not be refilled after hours or on weekends.  Regional Anesthesia Blocks  1. Numbness or the inability to move the "blocked" extremity may last from 3-48 hours after placement. The length of time depends on the medication injected and your individual response to the medication. If the numbness is not going away after 48 hours, call your surgeon.  2. The extremity that is blocked will need to be protected until the numbness is gone and the  Strength has returned. Because you cannot feel it, you will need to take extra care to avoid injury. Because it may be weak, you may have difficulty moving it or using it. You may not know what position it is in without looking at it while the block is in effect.  3. For blocks in the legs and feet, returning to weight  bearing and walking needs to be done carefully. You will need to wait until the numbness is entirely gone and the strength has returned. You should be able to move your leg and foot normally before you try and bear weight or walk. You will need someone to be with you when you first try to ensure you do not fall and possibly risk injury.  4. Bruising and tenderness at the needle site are common side effects and will resolve in a few days.  5. Persistent numbness or new problems with movement should be communicated to the surgeon or the [facility] [phone])/ [facility] [phone]).  Post Anesthesia Home Care Instructions  Activity: Get plenty of rest for the remainder of the day. A responsible adult should stay with you for 24 hours following the procedure.  For the next 24 hours, DO NOT: -Drive a car -Advertising copywriterperate machinery -Drink alcoholic beverages -Take any medication unless instructed by your physician -Make any legal decisions or sign important papers.  Meals: Start with liquid foods such as gelatin or soup. Progress to regular foods as tolerated. Avoid greasy, spicy, heavy foods. If nausea and/or vomiting occur, drink only clear liquids until the nausea and/or vomiting subsides. Call your physician if vomiting continues.  Special Instructions/Symptoms: Your throat may feel dry or sore from the anesthesia or the breathing tube placed in your  throat during surgery. If this causes discomfort, gargle with warm salt water. The discomfort should disappear within 24 hours.  If you had a scopolamine patch placed behind your ear for the management of post- operative nausea and/or vomiting:  1. The medication in the patch is effective for 72 hours, after which it should be removed.  Wrap patch in a tissue and discard in the trash. Wash hands thoroughly with soap and water. 2. You may remove the patch earlier than 72 hours if you experience unpleasant side effects  which may include dry mouth, dizziness or visual disturbances. 3. Avoid touching the patch. Wash your hands with soap and water after contact with the patch.

## 2016-03-30 NOTE — Anesthesia Procedure Notes (Signed)
Procedure Name: LMA Insertion Date/Time: 03/30/2016 11:03 AM Performed by: Kenneth Haynes, Kenneth Carrell S Pre-anesthesia Checklist: Patient identified, Emergency Drugs available, Suction available and Patient being monitored Patient Re-evaluated:Patient Re-evaluated prior to inductionOxygen Delivery Method: Circle system utilized Preoxygenation: Pre-oxygenation with 100% oxygen Intubation Type: IV induction Ventilation: Mask ventilation without difficulty LMA: LMA inserted LMA Size: 4.0 Number of attempts: 1 Airway Equipment and Method: Bite block Placement Confirmation: positive ETCO2 Tube secured with: Tape Dental Injury: Teeth and Oropharynx as per pre-operative assessment

## 2016-03-30 NOTE — Op Note (Signed)
03/30/2016  10:38 AM  PATIENT:  Kenneth Haynes  20 y.o. male  PRE-OPERATIVE DIAGNOSIS:  Left displaced juxta articular distal radius fracture  POST-OPERATIVE DIAGNOSIS:  Same  PROCEDURE:  ORIF juxta articular distal radius fracture, [id number]  SURGEON: Cliffton Astersavid A. Janee Mornhompson, MD  PHYSICIAN ASSISTANT: Danielle RankinKirsten Schrader, OPA-C  ANESTHESIA:  regional and general  SPECIMENS:  None  DRAINS: None  EBL:  less than 50 mL  PREOPERATIVE INDICATIONS:  [name] is a  20 y.o. male with a very distal juxta articular but apparently extra-articular left distal radius fracture with a significantly displaced volar fragment.  The risks benefits and alternatives were discussed with the patient preoperatively including but not limited to the risks of infection, bleeding, nerve injury, cardiopulmonary complications, the need for revision surgery, among others, and the patient verbalized understanding and consented to proceed.  OPERATIVE IMPLANTS: Skeletal dynamics Geminis distal radius plate/screws.  OPERATIVE PROCEDURE: After receiving prophylactic antibiotics and a regional block, the patient was escorted to the operative theatre and placed in a supine position.  General anesthesia was administered.  A surgical "time-out" was performed during which the planned procedure, proposed operative site, and the correct patient identity were compared to the operative consent and agreement confirmed by the circulating nurse according to current facility policy. Following application of a tourniquet to the operative extremity, the exposed skin was pre-scrub with Hibiclens scrub brush and then was prepped with Chloraprep and draped in the usual sterile fashion. The limb was exsanguinated with an Esmarch bandage and the tourniquet inflated to approximately 100mmHg higher than systolic BP.   A sinusoidal-shaped incision was marked and made over the FCR axis and the distal forearm. The skin was incised sharply with  scalpel, subcutaneous tissues with blunt and spreading dissection. The FCR axis was exploited deeply. The pronator quadratus was reflected in an L-shaped ulnarly. The fracture was inspected and provisionally reduced, including the displaced volar fragment that constituted a good portion of the distal volar cortex..  This was confirmed fluoroscopically. The appropriately sized plate was selected and found to fit well. It was placed in its provisional alignment of the radius and this was confirmed fluoroscopically.  It was secured to the radius with a screw through the slotted hole.  Additional adjustments were made as necessary, and the distal holes were all drilled and filled.  Peg/screw length distally was selected on the shorter side of measurements to minimize the risk for dorsal cortical penetration. The remainder of the proximal holes were drilled and filled.   Final images were obtained and the DRUJ was examined for stability. It was found to be sufficiently stable. The wound was then copiously irrigated and the brachioradialis repaired with 2-0 Vicryl Rapide suture followed by repair of the pronator quadratus with the same suture type. Tourniquet was released and additional hemostasis obtained and the skin was closed with 2-0 Vicryl deep dermal buried sutures followed by running 4-0 Vicryl Rapide horizontal mattress suture in the skin. A bulky dressing with a volar plaster component was applied and she was taken to room stable condition.  DISPOSITION: The patient will be discharged home today with typical post-op instructions, returning in 10-15 days for reevaluation with new x-rays of the affected wrist out of the splint to include an inclined lateral and then transition to therapy to have a custom splint constructed and begin rehabilitation.

## 2016-03-30 NOTE — Transfer of Care (Signed)
Immediate Anesthesia Transfer of Care Note  Patient: Kenneth Haynes  Procedure(s) Performed: Procedure(s) with comments: OPEN TREATMENT OF LEFT DISTAL RADIUS FRACTURE (Left) - GENERAL ANESTHESIA WITH PRE-OP BLOCK  Patient Location: PACU  Anesthesia Type:GA combined with regional for post-op pain  Level of Consciousness: sedated and responds to stimulation  Airway & Oxygen Therapy: Patient Spontanous Breathing and Patient connected to face mask oxygen  Post-op Assessment: Report given to RN and Post -op Vital signs reviewed and stable  Post vital signs: Reviewed and stable  Last Vitals:  Vitals:   03/30/16 1015 03/30/16 1020  BP: 131/67   Pulse: 79 72  Resp: 12 11  Temp:      Last Pain:  Vitals:   03/30/16 0947  TempSrc: Oral         Complications: No apparent anesthesia complications

## 2016-03-30 NOTE — Interval H&P Note (Signed)
History and Physical Interval Note:  03/30/2016 10:12 AM  Kenneth Haynes  has presented today for surgery, with the diagnosis of LEFT DISTAL RADIUS FRACTURE S52.552A  The various methods of treatment have been discussed with the patient and family. After consideration of risks, benefits and other options for treatment, the patient has consented to  Procedure(s) with comments: OPEN TREATMENT OF LEFT DISTAL RADIUS FRACTURE (Left) - GENERAL ANESTHESIA WITH PRE-OP BLOCK as a surgical intervention .  The patient's history has been reviewed, patient examined, no change in status, stable for surgery.  I have reviewed the patient's chart and labs.  Questions were answered to the patient's satisfaction.     Terena Bohan A.

## 2016-03-30 NOTE — H&P (View-Only) (Signed)
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  He presented with swelling, deformity, and pain of the left wrist.  X-rays again obtained revealing a markedly displaced distal radius fracture with a separate third fragment.  It is difficult to tell whether there is intra-articular extension.  History reviewed. No pertinent past medical history. History reviewed. No pertinent surgical history. Social History   Social History  . Marital status: Single    Spouse name: N/A  . Number of children: N/A  . Years of education: N/A   Social History Main Topics  . Smoking status: None  . Smokeless tobacco: None  . Alcohol use None  . Drug use: Unknown  . Sexual activity: Not Asked   Other Topics Concern  . None   Social History Narrative  . None   No family history on file. No Known Allergies Prior to Admission medications   Not on File   Dg Wrist 2 Views Left  Addendum Date: 03/22/2016   ADDENDUM REPORT: 03/22/2016 16:13 ADDENDUM: A lateral view was performed and demonstrates 1.8 cm dorsal displacement and apex volar angulation of the distal radial fracture. Electronically Signed   By: Jeffrey  Hu M.D.   On: 03/22/2016 16:13   Result Date: 03/22/2016 CLINICAL DATA:  Acute left wrist pain and deformity following football injury today. Initial encounter. EXAM: LEFT WRIST - 2 VIEW COMPARISON:  None. FINDINGS: A mildly comminuted distal radial fracture is noted with at least 2 cm ulnar and dorsal displacement and angulation. The radiocarpal joint appears intact. No other fractures are identified. IMPRESSION: Displaced and angulated mildly comminuted distal radial fracture. Electronically Signed: By: Jeffrey  Hu M.D. On: 03/22/2016 15:15    Positive ROS: All other  systems have been reviewed and were otherwise negative with the exception of those mentioned in the HPI and as above.  Physical Exam: Vitals: Refer to EMR. Constitutional:  WD, WN, NAD HEENT:  NCAT, EOMI Neuro/Psych:  Alert & oriented to person, place, and time; appropriate mood & affect Lymphatic: No generalized extremity edema or lymphadenopathy Extremities / MSK:  The extremities are normal with respect to appearance, ranges of motion, joint stability, muscle strength/tone, sensation, & perfusion except as otherwise noted:  Left wrist markedly deformed with obvious dorsal translational displacement of the distal aspect.  The wrist is swollen, forearm compartments not tense.  Intact light touch sensibility in the radial, median, and ulnar nerve distributions with intact motor to the same.  Nailbeds pink, capillary refill brisk.  Radial pulse palpable.  No tenderness about the elbow.  Assessment: Displaced left distal radius fracture with at least 2 distal fragments  Plan: I discussed these findings with the patient, his mother, and his coach.  I recommended a plan for closed reduction today in emergency department with conscious sedation provided by the emergency department.  They consented to proceed in this was performed with gentle manipulative reduction.  Portable x-ray was available and guided the reduction.  The alignment of the distal articular margin shaft of the radius was near-anatomic following reduction, but the third separate fragment appeared to be an extruded fragment and was resting volar to the re-remainder of the radius.  Sugar tong splint was applied and he was placed into a sling.  He will be discharged in emergency department   today with precautions regarding neurovascular compromise and swelling, an analgesic plan, and we will also obtain a CT scan of the left wrist prior to discharge to assist in operative planning.  My office will contact him Monday or Tuesday regarding  follow-up plan and timing for surgery, which is likely to be on Monday, 03-30-16.  Cookie Pore A. Clever Geraldo, MD      Orthopaedic & Hand Surgery [facility] [address] [place], [place]  [address] Office: [phone] Mobile: [phone]  03/22/2016, 5:12 PM    

## 2016-03-31 ENCOUNTER — Encounter (HOSPITAL_BASED_OUTPATIENT_CLINIC_OR_DEPARTMENT_OTHER): Payer: Self-pay | Admitting: Orthopedic Surgery

## 2016-04-14 ENCOUNTER — Ambulatory Visit: Payer: Self-pay | Attending: Orthopedic Surgery | Admitting: *Deleted

## 2016-11-12 IMAGING — CT CT WRIST*L* W/O CM
2 series · 15 of 20 positions shown, 18 images · non-contrast
Comparison: Plain films same day.

CLINICAL DATA: Football injury with left wrist deformity and
swelling.

EXAM:
CT OF THE LEFT WRIST WITHOUT CONTRAST
TECHNIQUE: Multidetector CT imaging was performed according to the standard
protocol. Multiplanar CT image reconstructions were also generated.

[Series 4: lt wrist · axial · 0.65mm/px · z∈[-202,-92]mm · 12 of 66 slices shown, 15 images]
[im 6/66  soft-tissue]
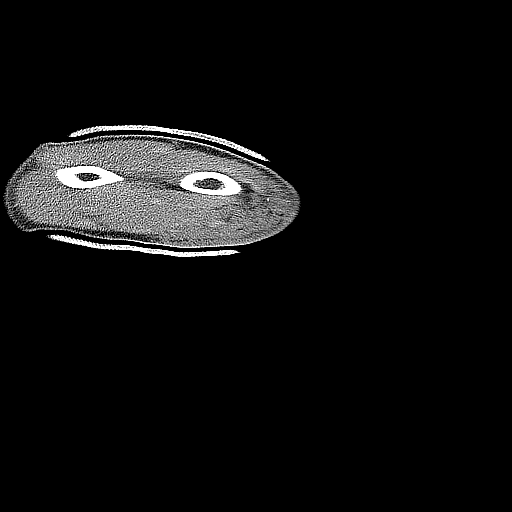
[im 6/66  bone]
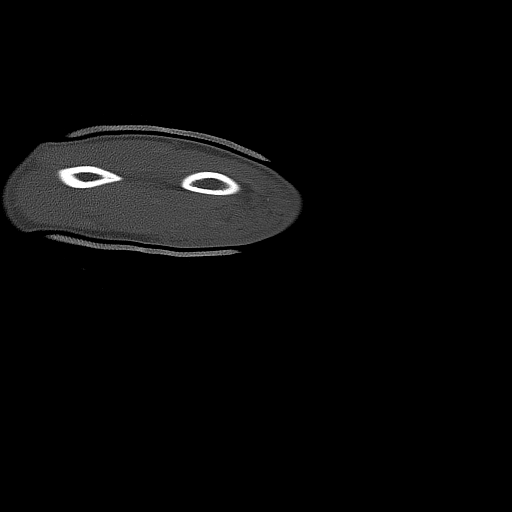
[im 11/66  bone]
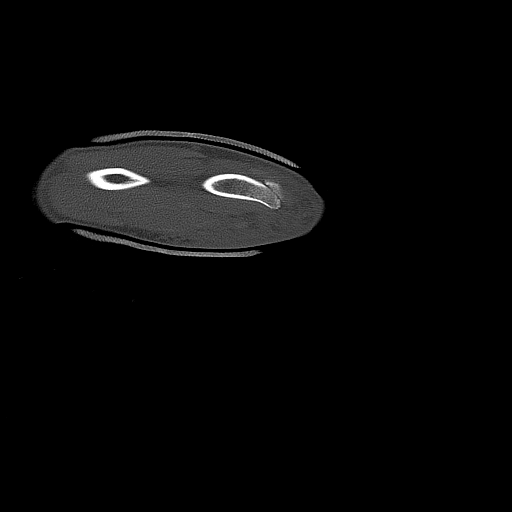
[im 16/66  bone]
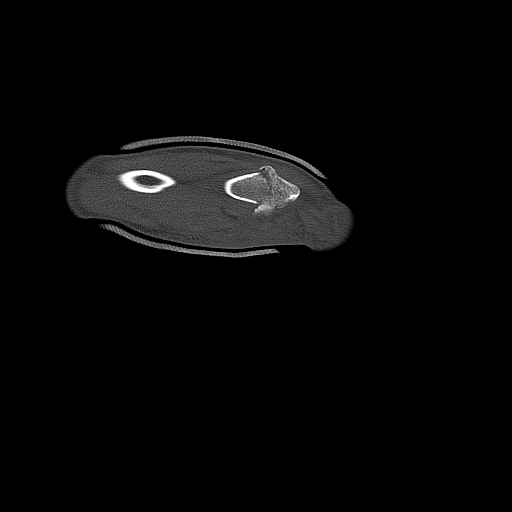
[im 21/66  bone]
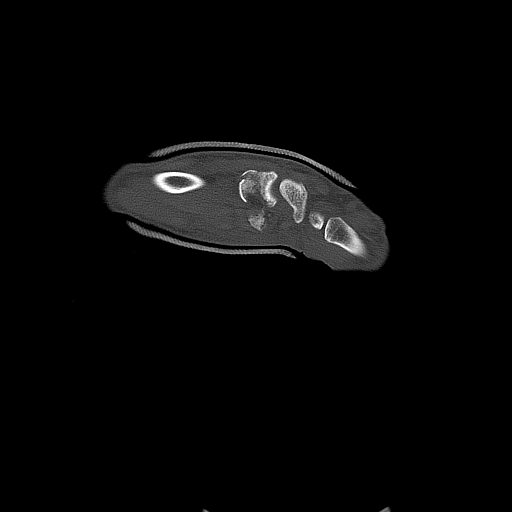
[im 26/66  soft-tissue]
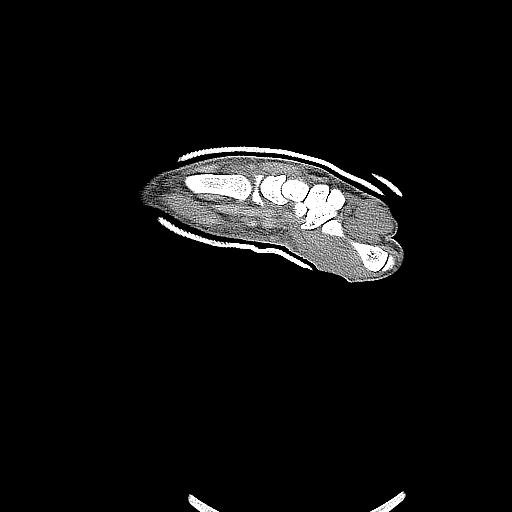
[im 26/66  bone]
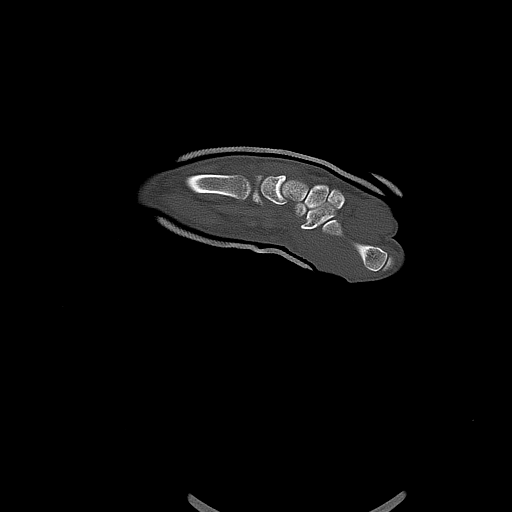
[im 31/66  bone]
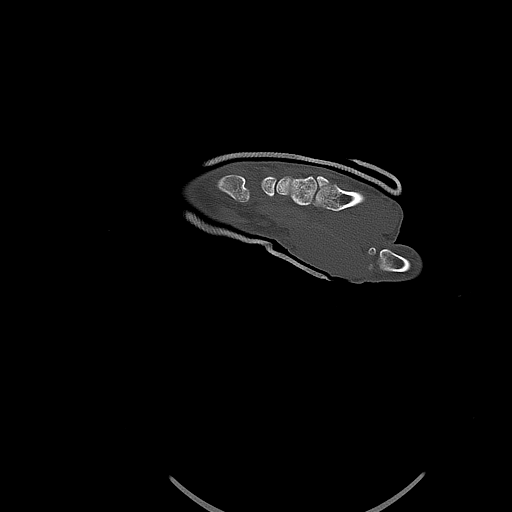
[im 36/66  bone]
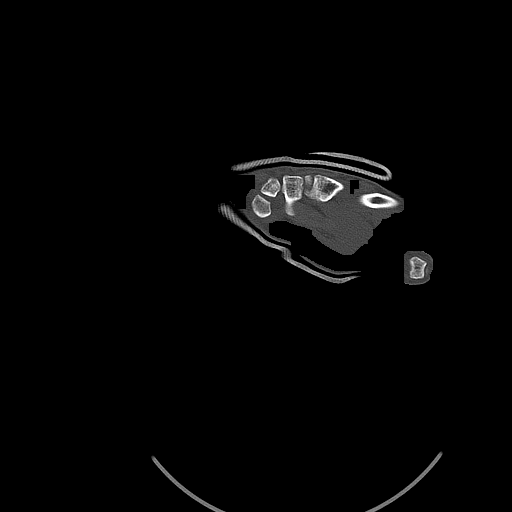
[im 41/66  bone]
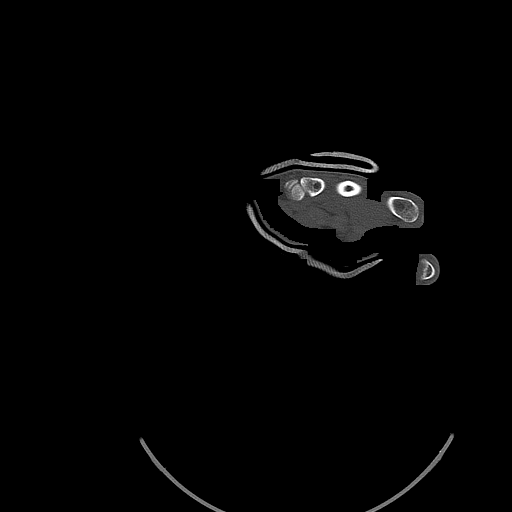
[im 46/66  soft-tissue]
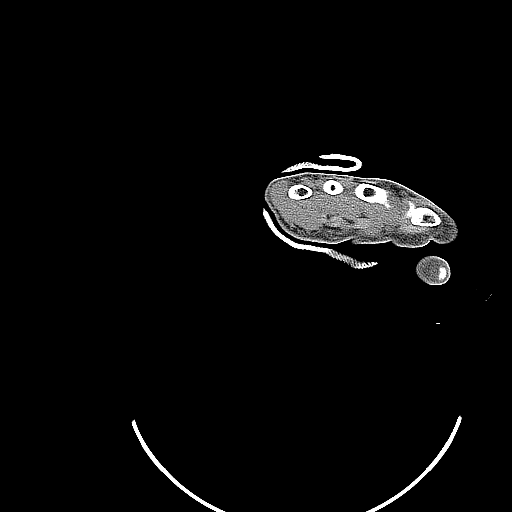
[im 46/66  bone]
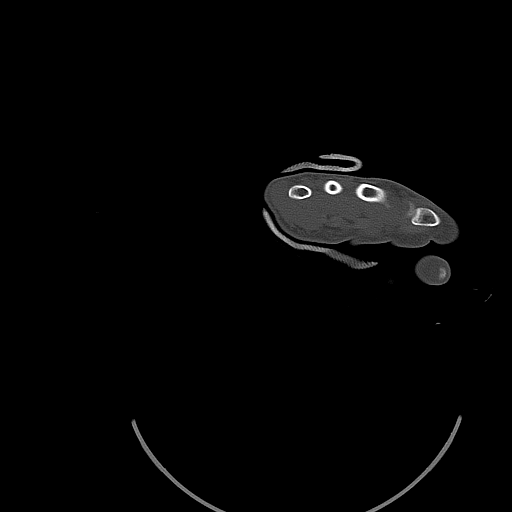
[im 51/66  bone]
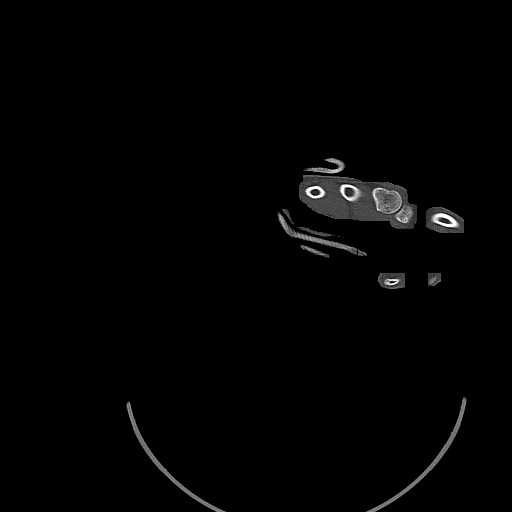
[im 56/66  bone]
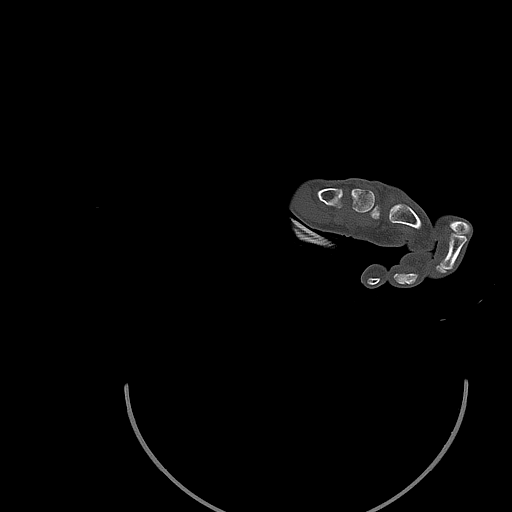
[im 61/66  bone]
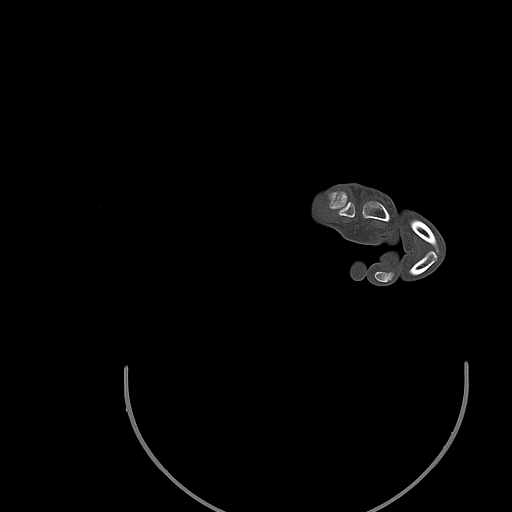

[Series 604: <mpr thick range(2)> · coronal · 0.65mm/px · 3 of 52 slices shown]
[im 11/52  bone]
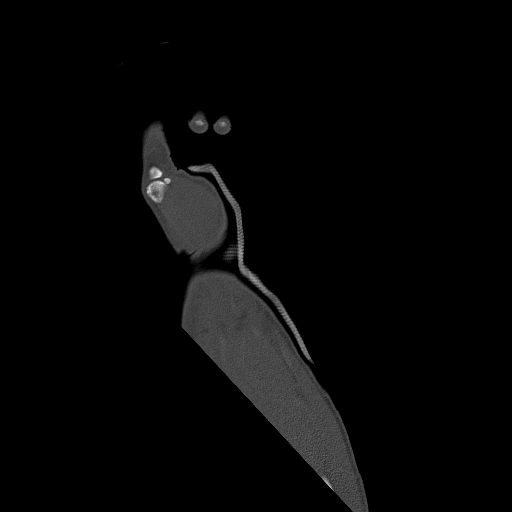
[im 21/52  bone]
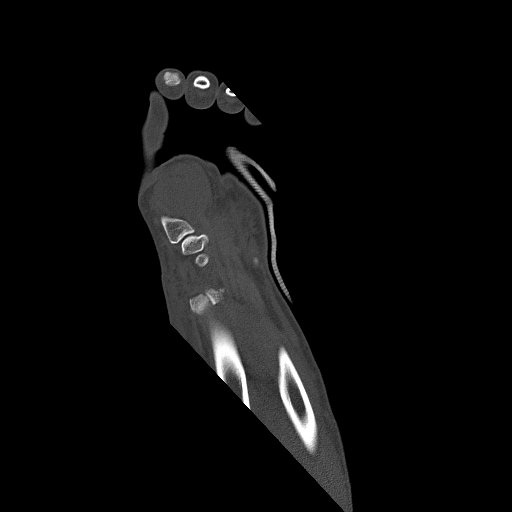
[im 31/52  bone]
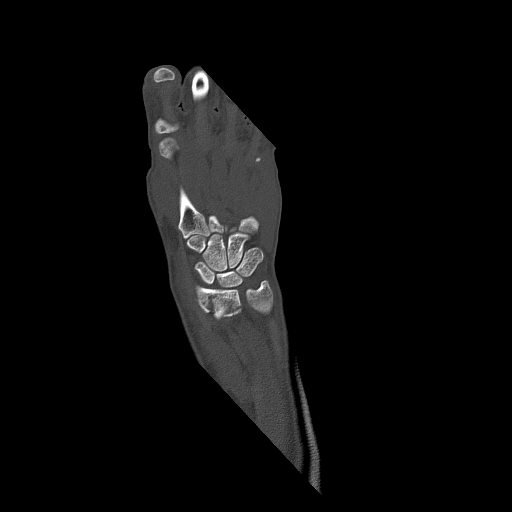

[15 of 20 positions shown; findings below may reference images not displayed]

FINDINGS: Bones/Joint/Cartilage

Examination demonstrates evidence patient's known comminuted
fracture of the distal radial metaphysis with extension to the
articular surface. There is near anatomic alignment about the
fracture site, although there is a 2.4 cm displaced fragment along
the volar aspect of the fracture. This fragment lies along the
radial aspect of the extensor tendons. Distal radial ulnar joint is
within normal. No additional fractures are visualized.

Ligaments

Suboptimally assessed by CT.

Muscles and Tendons

Within normal.

Soft tissues

Mild generalized subcutaneous edema over the wrist.
IMPRESSION: Evidence of patient's known comminuted fracture of the distal radial
metaphysis. There is near anatomic alignment about the fracture
site, although there is a displaced 2.4 cm fragment anteriorly at
the fracture site lying along the radial aspect of the extension
tendons.

## 2016-11-20 IMAGING — RF DG C-ARM 61-120 MIN
1 series · 4 of 4 positions shown · non-contrast
Comparison: Radiographs 03/22/2016

CLINICAL DATA: Internal fixation of left distal radius fracture.

EXAM:
DG C-ARM 61-120 MIN

[Series 1: run · 4 of 4 slices shown]
[im 1/4]
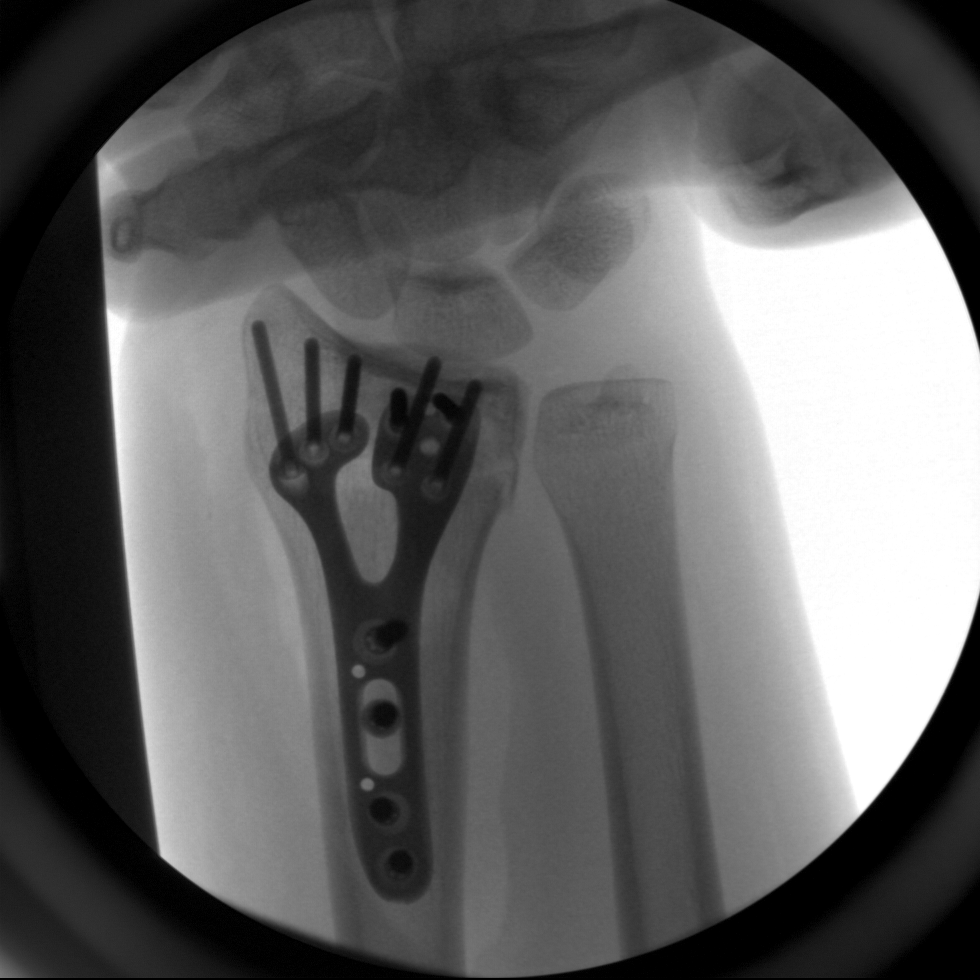
[im 2/4]
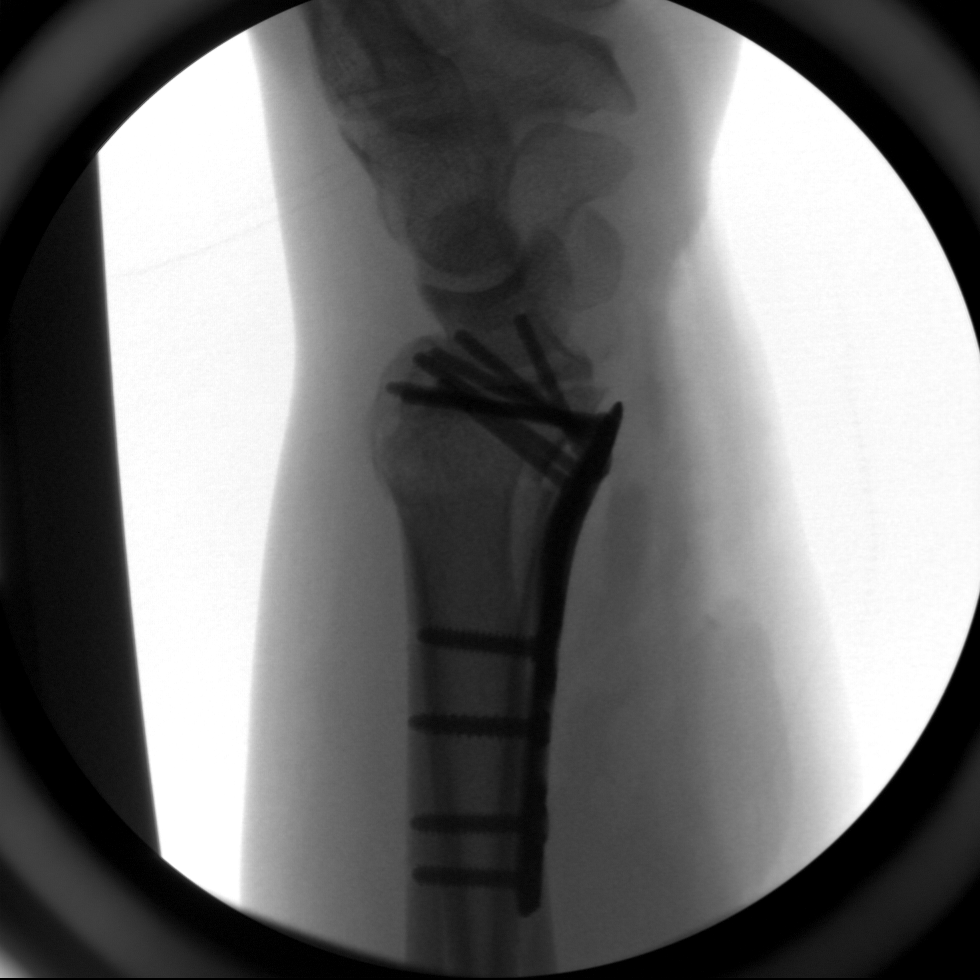
[im 3/4]
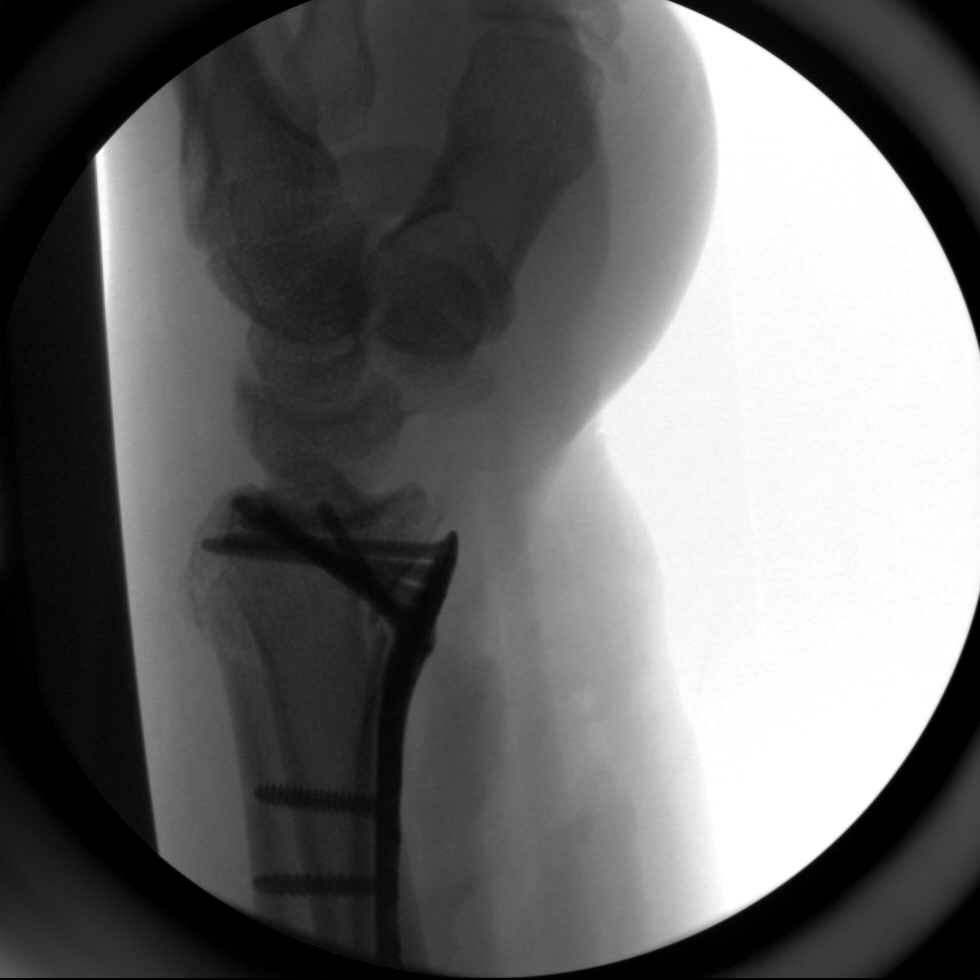
[im 4/4]
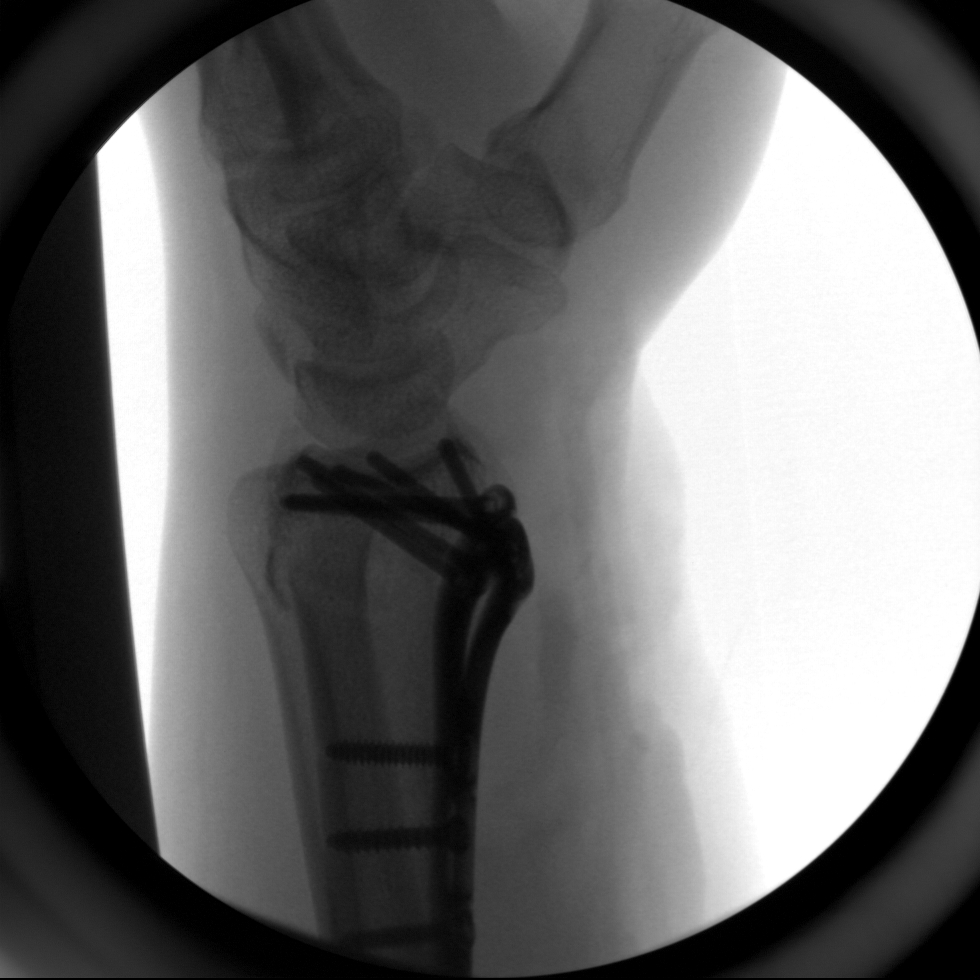

[4 of 4 positions shown; findings below may reference images not displayed]

FINDINGS: Multiple fluoroscopic spot images demonstrate a volar plate and
screws transfixing the comminuted distal radius fracture. Good
position and alignment without complicating features.
IMPRESSION: Open reduction and internal fixation of a comminuted distal radius
fracture with good position and alignment and no complicating
features.

## 2024-05-09 ENCOUNTER — Ambulatory Visit: Payer: Self-pay

## 2024-05-09 ENCOUNTER — Other Ambulatory Visit: Payer: Self-pay

## 2024-05-09 ENCOUNTER — Emergency Department (HOSPITAL_COMMUNITY)
Admission: EM | Admit: 2024-05-09 | Discharge: 2024-05-09 | Disposition: A | Discharge status: Home / Self Care | Attending: Emergency Medicine | Admitting: Emergency Medicine

## 2024-05-09 DIAGNOSIS — T7840XA Allergy, unspecified, initial encounter: Secondary | ICD-10-CM | POA: Diagnosis present

## 2024-05-09 MED ORDER — FAMOTIDINE 20 MG PO TABS: 20.0000 mg | ORAL_TABLET | Freq: Two times a day (BID) | ORAL | 0 refills | Status: DC

## 2024-05-09 MED ORDER — PREDNISONE 20 MG PO TABS
60.0000 mg | ORAL_TABLET | Freq: Once | ORAL | Status: AC
  Administered 2024-05-09: 60 mg via ORAL
  Filled 2024-05-09: qty 3

## 2024-05-09 MED ORDER — DIPHENHYDRAMINE HCL 25 MG PO TABS: 25.0000 mg | ORAL_TABLET | Freq: Four times a day (QID) | ORAL | 0 refills | Status: AC | PRN

## 2024-05-09 MED ORDER — FAMOTIDINE 20 MG PO TABS: 20.0000 mg | ORAL_TABLET | Freq: Two times a day (BID) | ORAL | 0 refills | Status: AC

## 2024-05-09 MED ORDER — PREDNISONE 20 MG PO TABS: ORAL_TABLET | ORAL | 0 refills | Status: AC

## 2024-05-09 MED ORDER — FAMOTIDINE 20 MG PO TABS
20.0000 mg | ORAL_TABLET | Freq: Once | ORAL | Status: AC
  Administered 2024-05-09: 20 mg via ORAL
  Filled 2024-05-09: qty 1

## 2024-05-09 MED ORDER — DIPHENHYDRAMINE HCL 25 MG PO CAPS
50.0000 mg | ORAL_CAPSULE | Freq: Once | ORAL | Status: AC
  Administered 2024-05-09: 50 mg via ORAL
  Filled 2024-05-09: qty 2

## 2024-05-09 NOTE — ED Notes (Signed)
 PT discharged to home. Breathing is even and unlabored. Skin warm, dry, and natural in color. PT educated on follow up. PT to follow up as directed. PT denies any questions at this time.

## 2024-05-09 NOTE — ED Notes (Signed)
Breathing is even and unlabored.

## 2024-05-09 NOTE — ED Provider Notes (Signed)
 [employer] EMERGENCY DEPARTMENT AT [facility] Provider Note   CSN: [id number] Arrival date & time: 05/09/24  9153     Patient presents with: Allergic Reaction   Kenneth Haynes is a 28 y.o. male.   Patient developed facial rash yesterday after taking exlax, then awoke this morning with facial swelling around eyes. No shortness of breath, difficulty swallowing, nausea, vomiting or abdominal pain. Endorses soft tissue penile swelling without difficulty urinating.  The history is provided by the patient. No language interpreter was used.  Allergic Reaction Presenting symptoms: rash and swelling   Presenting symptoms: no difficulty breathing and no difficulty swallowing   Severity:  Mild Duration:  1 day Prior allergic episodes:  No prior episodes Ineffective treatments:  None tried      Prior to Admission medications   Medication Sig Start Date End Date Taking? Authorizing Provider  ibuprofen (ADVIL,MOTRIN) 200 MG tablet Take 200 mg by mouth every 6 (six) hours as needed.    [provider]  oxyCODONE -acetaminophen  (PERCOCET/ROXICET) 5-325 MG tablet Take 1-2 tablets by mouth every 4 (four) hours as needed for severe pain. 03/30/16   Sebastian Lenis, MD    Allergies: Patient has no allergy information on record.    Review of Systems  HENT:  Negative for trouble swallowing.   Skin:  Positive for rash.  All other systems reviewed and are negative.   Updated Vital Signs BP 135/72   Pulse 79   Temp 98.2 F (36.8 C) (Oral)   Resp 18   Ht 5' 11 (1.803 m)   Wt 108.9 kg   SpO2 98%   BMI 33.47 kg/m   Physical Exam Vitals and nursing note reviewed.  Constitutional:      Appearance: Normal appearance.  HENT:     Head: Normocephalic.     Comments: Swelling around eyes    Nose: Nose normal.     Mouth/Throat:     Mouth: Mucous membranes are moist.  Eyes:     Conjunctiva/sclera: Conjunctivae normal.     Pupils: Pupils are equal, round, and reactive  to light.  Cardiovascular:     Rate and Rhythm: Normal rate and regular rhythm.  Pulmonary:     Effort: Pulmonary effort is normal.     Breath sounds: Normal breath sounds.  Abdominal:     Palpations: Abdomen is soft.  Musculoskeletal:        General: Normal range of motion.     Cervical back: Normal range of motion.  Skin:    General: Skin is warm and dry.  Neurological:     Mental Status: He is alert and oriented to person, place, and time.  Psychiatric:        Mood and Affect: Mood normal.        Behavior: Behavior normal.     (all labs ordered are listed, but only abnormal results are displayed) Labs Reviewed - No data to display  EKG: None  Radiology: No results found.   Procedures   Medications Ordered in the ED  predniSONE (DELTASONE) tablet 60 mg (60 mg Oral Given 05/09/24 1547)  famotidine (PEPCID) tablet 20 mg (20 mg Oral Given 05/09/24 1547)  diphenhydrAMINE (BENADRYL) capsule 50 mg (50 mg Oral Given 05/09/24 1547)                                    Medical Decision Making Risk OTC drugs. Prescription drug  management.   Patient re-evaluated prior to dc, is hemodynamically stable, in no respiratory distress, and denies the feeling of throat closing. Pt has been advised to take OTC benadryl & return to the ED and/or contact EMS if they have a mod-severe allergic rxn (s/s including throat closing, difficulty breathing, swelling of lips face or tongue). Pt to return for evaluation if symptoms do not subside. Pt is agreeable with plan & verbalizes understanding.      Final diagnoses:  Allergic reaction, initial encounter    ED Discharge Orders          Ordered    famotidine (PEPCID) 20 MG tablet  2 times daily        05/09/24 1532    predniSONE (DELTASONE) 20 MG tablet        05/09/24 1532    diphenhydrAMINE (BENADRYL) 25 MG tablet  Every 6 hours PRN        05/09/24 1532               Claudene Lenis, NP 05/09/24 1821    Patt Lenis Macho, MD 05/09/24 2235

## 2024-05-09 NOTE — ED Triage Notes (Signed)
 Pt took Exlax on Sunday, which he has not taken since childhood, yesterday developed rash on face and today woke up with a swollen face. Swelling around eyes, denies any oral swelling or trouble breathing. Pt also says he has swelling on penis starting last night.

## 2024-05-09 NOTE — Discharge Instructions (Addendum)
 Please refer to the attached instructions. Take medication as directed.
